# Patient Record
Sex: Female | Born: 1965 | Race: Black or African American | Hispanic: No | Marital: Single | Smoking: Never smoker
Health system: Southern US, Community
[De-identification: ages and names within clinical notes are randomized; demographics above are authoritative.]

## PROBLEM LIST (undated history)

## (undated) DIAGNOSIS — M329 Systemic lupus erythematosus, unspecified: Secondary | ICD-10-CM

## (undated) DIAGNOSIS — I10 Essential (primary) hypertension: Secondary | ICD-10-CM

## (undated) DIAGNOSIS — M797 Fibromyalgia: Secondary | ICD-10-CM

## (undated) DIAGNOSIS — E119 Type 2 diabetes mellitus without complications: Secondary | ICD-10-CM

---

## 2005-10-24 ENCOUNTER — Ambulatory Visit (HOSPITAL_COMMUNITY): Admission: RE | Admit: 2005-10-24 | Discharge: 2005-10-24 | Payer: Self-pay | Admitting: Obstetrics & Gynecology

## 2009-12-28 ENCOUNTER — Emergency Department (HOSPITAL_COMMUNITY): Admission: EM | Admit: 2009-12-28 | Discharge: 2009-12-28 | Payer: Self-pay | Admitting: Emergency Medicine

## 2011-03-10 LAB — BASIC METABOLIC PANEL
BUN: 9 mg/dL (ref 6–23)
CO2: 28 mEq/L (ref 19–32)
Calcium: 9.2 mg/dL (ref 8.4–10.5)
Chloride: 102 mEq/L (ref 96–112)
Creatinine, Ser: 0.89 mg/dL (ref 0.4–1.2)
GFR calc Af Amer: >60 mL/min (ref >60)
GFR calc non Af Amer: >60 mL/min (ref >60)
Glucose, Bld: 135 mg/dL — ABNORMAL HIGH (ref 70–99)
Potassium: 3.8 mEq/L (ref 3.5–5.1)
Sodium: 137 mEq/L (ref 135–145)

## 2011-03-10 LAB — CBC
HCT: 39.7 % (ref 36.0–46.0)
Hemoglobin: 13.4 g/dL (ref 12.0–15.0)
MCHC: 33.8 g/dL (ref 30.0–36.0)
MCV: 91.8 fL (ref 78.0–100.0)
RBC: 4.32 MIL/uL (ref 3.87–5.11)
RDW: 13.3 % (ref 11.5–15.5)
WBC: 5.6 10*3/uL (ref 4.0–10.5)

## 2011-03-10 LAB — DIFFERENTIAL
Basophils Absolute: 0 10*3/uL (ref 0.0–0.1)
Basophils Relative: 0 % (ref 0–1)
Eosinophils Absolute: 0 10*3/uL (ref 0.0–0.7)
Eosinophils Relative: 0 % (ref 0–5)
Lymphocytes Relative: 27 % (ref 12–46)
Lymphs Abs: 1.5 10*3/uL (ref 0.7–4.0)
Monocytes Absolute: 0.3 10*3/uL (ref 0.1–1.0)
Monocytes Relative: 6 % (ref 3–12)
Neutro Abs: 3.8 10*3/uL (ref 1.7–7.7)
Neutrophils Relative %: 67 % (ref 43–77)

## 2011-03-10 LAB — POCT CARDIAC MARKERS
CKMB, poc: <1 ng/mL — ABNORMAL LOW (ref 1.0–8.0)
Myoglobin, poc: 131 ng/mL (ref 12–200)
Troponin i, poc: <0.05 ng/mL (ref 0.00–0.09)

## 2011-05-09 ENCOUNTER — Other Ambulatory Visit: Payer: Self-pay | Admitting: Occupational Medicine

## 2011-05-09 ENCOUNTER — Ambulatory Visit: Payer: Self-pay

## 2011-05-09 DIAGNOSIS — M549 Dorsalgia, unspecified: Secondary | ICD-10-CM

## 2021-07-23 ENCOUNTER — Emergency Department (HOSPITAL_COMMUNITY)
Admission: EM | Admit: 2021-07-23 | Discharge: 2021-07-23 | Disposition: A | Discharge status: Home / Self Care | Payer: Medicaid Other | Attending: Emergency Medicine | Admitting: Emergency Medicine

## 2021-07-23 ENCOUNTER — Encounter (HOSPITAL_COMMUNITY): Payer: Self-pay | Admitting: *Deleted

## 2021-07-23 ENCOUNTER — Other Ambulatory Visit: Payer: Self-pay

## 2021-07-23 DIAGNOSIS — R1013 Epigastric pain: Secondary | ICD-10-CM

## 2021-07-23 DIAGNOSIS — E119 Type 2 diabetes mellitus without complications: Secondary | ICD-10-CM | POA: Insufficient documentation

## 2021-07-23 DIAGNOSIS — R110 Nausea: Secondary | ICD-10-CM | POA: Diagnosis not present

## 2021-07-23 HISTORY — DX: Type 2 diabetes mellitus without complications: E11.9

## 2021-07-23 LAB — CBC
HCT: 41.9 % (ref 36.0–46.0)
Hemoglobin: 13.5 g/dL (ref 12.0–15.0)
MCH: 30.3 pg (ref 26.0–34.0)
MCHC: 32.2 g/dL (ref 30.0–36.0)
MCV: 94.2 fL (ref 80.0–100.0)
Platelets: 277 10*3/uL (ref 150–400)
RBC: 4.45 MIL/uL (ref 3.87–5.11)
RDW: 13.5 % (ref 11.5–15.5)
WBC: 8.2 10*3/uL (ref 4.0–10.5)
nRBC: 0 % (ref 0.0–0.2)

## 2021-07-23 LAB — COMPREHENSIVE METABOLIC PANEL
ALT: 17 U/L (ref 0–44)
AST: 19 U/L (ref 15–41)
Albumin: 3.5 g/dL (ref 3.5–5.0)
Alkaline Phosphatase: 81 U/L (ref 38–126)
Anion gap: 7 (ref 5–15)
BUN: 13 mg/dL (ref 6–20)
CO2: 26 mmol/L (ref 22–32)
Calcium: 9.5 mg/dL (ref 8.9–10.3)
Chloride: 103 mmol/L (ref 98–111)
Creatinine, Ser: 0.92 mg/dL (ref 0.44–1.00)
GFR, Estimated: >60 mL/min (ref >60)
Glucose, Bld: 138 mg/dL — ABNORMAL HIGH (ref 70–99)
Potassium: 4.5 mmol/L (ref 3.5–5.1)
Sodium: 136 mmol/L (ref 135–145)
Total Bilirubin: 0.6 mg/dL (ref 0.3–1.2)
Total Protein: 7 g/dL (ref 6.5–8.1)

## 2021-07-23 LAB — URINALYSIS, ROUTINE W REFLEX MICROSCOPIC
Bacteria, UA: NONE SEEN
Bilirubin Urine: NEGATIVE
Glucose, UA: NEGATIVE mg/dL
Hgb urine dipstick: NEGATIVE
Ketones, ur: NEGATIVE mg/dL
Leukocytes,Ua: NEGATIVE
Nitrite: NEGATIVE
Protein, ur: NEGATIVE mg/dL
Specific Gravity, Urine: 1.012 (ref 1.005–1.030)
pH: 6 (ref 5.0–8.0)

## 2021-07-23 LAB — I-STAT BETA HCG BLOOD, ED (MC, WL, AP ONLY): I-stat hCG, quantitative: <5 m[IU]/mL (ref <5)

## 2021-07-23 LAB — LIPASE, BLOOD: Lipase: 24 U/L (ref 11–51)

## 2021-07-23 LAB — CBG MONITORING, ED: Glucose-Capillary: 142 mg/dL — ABNORMAL HIGH (ref 70–99)

## 2021-07-23 MED ORDER — DICYCLOMINE HCL 20 MG PO TABS: 20.0000 mg | ORAL_TABLET | Freq: Two times a day (BID) | ORAL | 0 refills | Status: AC

## 2021-07-23 MED ORDER — FAMOTIDINE 20 MG PO TABS
20.0000 mg | ORAL_TABLET | Freq: Once | ORAL | Status: AC
  Administered 2021-07-23: 20 mg via ORAL
  Filled 2021-07-23: qty 1

## 2021-07-23 MED ORDER — DICYCLOMINE HCL 10 MG PO CAPS
20.0000 mg | ORAL_CAPSULE | Freq: Once | ORAL | Status: AC
  Administered 2021-07-23: 20 mg via ORAL
  Filled 2021-07-23: qty 2

## 2021-07-23 MED ORDER — ONDANSETRON 4 MG PO TBDP
4.0000 mg | ORAL_TABLET | Freq: Once | ORAL | Status: AC
  Administered 2021-07-23: 4 mg via ORAL
  Filled 2021-07-23: qty 1

## 2021-07-23 MED ORDER — ACETAMINOPHEN 325 MG PO TABS
650.0000 mg | ORAL_TABLET | Freq: Four times a day (QID) | ORAL | Status: DC | PRN
  Administered 2021-07-23: 650 mg via ORAL
  Filled 2021-07-23: qty 2

## 2021-07-23 MED ORDER — ONDANSETRON 4 MG PO TBDP: 4.0000 mg | ORAL_TABLET | Freq: Three times a day (TID) | ORAL | 0 refills | Status: AC | PRN

## 2021-07-23 NOTE — ED Triage Notes (Signed)
Patient states she woke up apporx. 4am with nausea and states she went to the bathroom has several bowel movements that were soft.

## 2021-07-23 NOTE — Discharge Instructions (Signed)
Please follow-up with your primary care provider. I have also given you the information for a gastroenterology office here in [place].  Please call to make appointment with them.  Please use medications I prescribed you which are Bentyl--for pain.  Zofran--for nausea.  Please continue taking your omeprazole.  Please drink plenty of water.  Return to the ER for any new or concerning symptoms.

## 2021-07-23 NOTE — ED Provider Notes (Signed)
[facility] EMERGENCY DEPARTMENT Provider Note   CSN: [id number] Arrival date & time: 07/23/21  2774     History Chief Complaint  Patient presents with   Abdominal Pain   Nausea    Lorraine Carroll is a 55 y.o. female.  HPI Patient is a 55 year old female presenting today with a history of diabetes mellitus currently on injectable antihyperglycemic therapy not on insulin.  Patient presents emergency department today with abdominal pain that she states she woke up with at approximately 4 AM.  She states that it is epigastric achy associated with nausea.  She states that she had some soft bowel movements over the past couple days.  And had a large soft bowel movement this morning after her abdomen started hurting.  She denies any bright red blood PR.  Denies any vomiting.  No chest pain or shortness of breath.  No lightheadedness or dizziness.  No fevers or chills.  She states that the pain has improved some since she came to the ER.  She states that she has not taken any medications.  She does take omeprazole daily for reflux however states that she does not always take this medication.  She continues to feel somewhat nauseous currently.  She states that over the past few years this is happened intermittently she states 2 times a month is not abnormal for her.  She states that it seemed to be more uncomfortable than usual for her this episode however that level of severity has improved.  She is no longer in severe pain as she was earlier today.  She states that she was seen by a gastroenterologist before she moved to the [place] area and was not told of any specific diagnosis that she carries.    Past Medical History:  Diagnosis Date   Diabetes mellitus without complication (HCC)     There are no problems to display for this patient.   The histories are not reviewed yet. Please review them in the "History" navigator section and refresh this SmartLink.   OB  History   No obstetric history on file.     No family history on file.  Social History   Tobacco Use   Smoking status: Never   Smokeless tobacco: Never  Substance Use Topics   Alcohol use: Never    Home Medications Prior to Admission medications   Not on File    Allergies    Patient has no allergy information on record.  Review of Systems   Review of Systems  Constitutional:  Negative for chills and fever.  HENT:  Negative for congestion.   Eyes:  Negative for pain.  Respiratory:  Negative for cough and shortness of breath.   Cardiovascular:  Negative for chest pain and leg swelling.  Gastrointestinal:  Positive for abdominal pain and nausea. Negative for diarrhea (Loose stool, no watery diarrhea) and vomiting.  Genitourinary:  Negative for dysuria.  Musculoskeletal:  Negative for myalgias.  Skin:  Negative for rash.  Neurological:  Negative for dizziness and headaches.   Physical Exam Updated Vital Signs BP (!) 142/88   Pulse 65   Temp 98.3 F (36.8 C) (Oral)   Resp 18   Ht 5\' 2"  (1.575 m)   Wt 93 kg   SpO2 96%   BMI 37.49 kg/m   Physical Exam Vitals and nursing note reviewed.  Constitutional:      General: She is not in acute distress.    Comments: Pleasant well-appearing 55 year old.  In  no acute distress.  Sitting comfortably in bed.  Able answer questions appropriately follow commands. No increased work of breathing. Speaking in full sentences.  HENT:     Head: Normocephalic and atraumatic.     Nose: Nose normal.  Eyes:     General: No scleral icterus. Cardiovascular:     Rate and Rhythm: Normal rate and regular rhythm.     Pulses: Normal pulses.     Heart sounds: Normal heart sounds.  Pulmonary:     Effort: Pulmonary effort is normal. No respiratory distress.     Breath sounds: No wheezing.  Abdominal:     Palpations: Abdomen is soft.     Tenderness: There is no abdominal tenderness.     Comments: Abdomen is soft, nontender, no guarding or  rebound.  No CVA tenderness.  Musculoskeletal:     Cervical back: Normal range of motion.     Right lower leg: No edema.     Left lower leg: No edema.  Skin:    General: Skin is warm and dry.     Capillary Refill: Capillary refill takes less than 2 seconds.  Neurological:     Mental Status: She is alert. Mental status is at baseline.  Psychiatric:        Mood and Affect: Mood normal.        Behavior: Behavior normal.    ED Results / Procedures / Treatments   Labs (all labs ordered are listed, but only abnormal results are displayed) Labs Reviewed  URINALYSIS, ROUTINE W REFLEX MICROSCOPIC - Abnormal; Notable for the following components:      Result Value   Color, Urine STRAW (*)    All other components within normal limits  CBG MONITORING, ED - Abnormal; Notable for the following components:   Glucose-Capillary 142 (*)    All other components within normal limits  CBC  LIPASE, BLOOD  COMPREHENSIVE METABOLIC PANEL  I-STAT BETA HCG BLOOD, ED ([facility], [facility], [facility] ONLY)    EKG None  Radiology No results found.  Procedures Procedures   Medications Ordered in ED Medications  ondansetron (ZOFRAN-ODT) disintegrating tablet 4 mg (4 mg Oral Given 07/23/21 0804)  dicyclomine (BENTYL) capsule 20 mg (20 mg Oral Given 07/23/21 0803)  famotidine (PEPCID) tablet 20 mg (20 mg Oral Given 07/23/21 0803)    ED Course  I have reviewed the triage vital signs and the nursing notes.  Pertinent labs & imaging results that were available during my care of the patient were reviewed by me and considered in my medical decision making (see chart for details).    MDM Rules/Calculators/A&P                           Patient is a 55 year old female with past medical history detailed in HPI primarily notable for DM2 on Ozempic.  Patient is presented today with epigastric abdominal pain that began this morning.  She has a history of similar.  She states that usually occurs twice per month.  She denies any  vomiting but states that she is somewhat nauseous.  Has some epigastric abdominal pain.  Denies any chest pain or shortness of breath.  Physical exam is unremarkable patient is not significantly tender in her abdomen.  I-STAT hCG is negative for pregnancy.  Lipase within normal limits doubt pancreatitis.  CMP unremarkable.  Urinalysis without any evidence of infection CBC unremarkable.  Vital signs within normal limits mildly hypertensive.  States that she has  not taken her blood pressure medicine today.  On my reassessment patient states that her abdominal pain is gone.  Her repeat abdominal exam is benign.  Notably she is now complaining of a headache and states that she feels it is because she has not eaten anything yet today.  We will provide her with 1 dose of Tylenol, give her some food via RN and recommend close follow-up with her PCP as well as return precautions which were given to patient.  She is tolerating p.o. and ambulatory at time of discharge.  Final Clinical Impression(s) / ED Diagnoses Final diagnoses:  Epigastric abdominal pain    Rx / DC Orders ED Discharge Orders     None        Gailen Shelter, [place] 07/23/21 1049    Arby Barrette, MD 07/24/21 1243

## 2021-07-23 NOTE — ED Notes (Signed)
Patient refused to have blood drawn at triage, I talked to her about getting care started while waiting for a treatment room , however she still refused.

## 2021-09-06 ENCOUNTER — Other Ambulatory Visit: Payer: Self-pay

## 2021-09-06 ENCOUNTER — Emergency Department (HOSPITAL_COMMUNITY): Payer: No Typology Code available for payment source

## 2021-09-06 ENCOUNTER — Emergency Department (HOSPITAL_COMMUNITY)
Admission: EM | Admit: 2021-09-06 | Discharge: 2021-09-06 | Disposition: A | Discharge status: Home / Self Care | Payer: No Typology Code available for payment source | Attending: Emergency Medicine | Admitting: Emergency Medicine

## 2021-09-06 ENCOUNTER — Encounter (HOSPITAL_COMMUNITY): Payer: Self-pay | Admitting: Emergency Medicine

## 2021-09-06 DIAGNOSIS — M25512 Pain in left shoulder: Secondary | ICD-10-CM | POA: Diagnosis not present

## 2021-09-06 DIAGNOSIS — Z794 Long term (current) use of insulin: Secondary | ICD-10-CM | POA: Diagnosis not present

## 2021-09-06 DIAGNOSIS — Z7984 Long term (current) use of oral hypoglycemic drugs: Secondary | ICD-10-CM | POA: Diagnosis not present

## 2021-09-06 DIAGNOSIS — Y92410 Unspecified street and highway as the place of occurrence of the external cause: Secondary | ICD-10-CM | POA: Insufficient documentation

## 2021-09-06 DIAGNOSIS — E119 Type 2 diabetes mellitus without complications: Secondary | ICD-10-CM | POA: Insufficient documentation

## 2021-09-06 DIAGNOSIS — M5441 Lumbago with sciatica, right side: Secondary | ICD-10-CM | POA: Insufficient documentation

## 2021-09-06 DIAGNOSIS — M79604 Pain in right leg: Secondary | ICD-10-CM | POA: Insufficient documentation

## 2021-09-06 DIAGNOSIS — M545 Low back pain, unspecified: Secondary | ICD-10-CM | POA: Diagnosis present

## 2021-09-06 MED ORDER — CYCLOBENZAPRINE HCL 10 MG PO TABS: 5.0000 mg | ORAL_TABLET | Freq: Two times a day (BID) | ORAL | 0 refills | Status: AC | PRN

## 2021-09-06 MED ORDER — NAPROXEN 375 MG PO TABS: 375.0000 mg | ORAL_TABLET | Freq: Two times a day (BID) | ORAL | 0 refills | Status: AC
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
## 2021-09-06 NOTE — Discharge Instructions (Addendum)

## 2021-09-06 NOTE — ED Notes (Signed)
Pt ambulatory with steady gait to restroom 
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
## 2021-09-11 ENCOUNTER — Other Ambulatory Visit: Payer: Self-pay

## 2021-09-11 ENCOUNTER — Ambulatory Visit (HOSPITAL_COMMUNITY)
Admission: EM | Admit: 2021-09-11 | Discharge: 2021-09-11 | Disposition: A | Discharge status: Home / Self Care | Payer: Medicaid Other

## 2021-09-11 ENCOUNTER — Encounter (HOSPITAL_COMMUNITY): Payer: Self-pay | Admitting: Emergency Medicine

## 2021-09-11 DIAGNOSIS — M7918 Myalgia, other site: Secondary | ICD-10-CM
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
## 2021-09-11 NOTE — Discharge Instructions (Signed)
-  Please pick up your prescriptions as sent by emergency department provider. -PCP referral placed
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
## 2024-08-16 ENCOUNTER — Emergency Department
Admission: EM | Admit: 2024-08-16 | Discharge: 2024-08-16 | Disposition: A | Discharge status: Home / Self Care | Attending: Emergency Medicine | Admitting: Emergency Medicine

## 2024-08-16 ENCOUNTER — Other Ambulatory Visit: Payer: Self-pay

## 2024-08-16 ENCOUNTER — Encounter: Payer: Self-pay | Admitting: Emergency Medicine

## 2024-08-16 ENCOUNTER — Emergency Department

## 2024-08-16 DIAGNOSIS — R059 Cough, unspecified: Secondary | ICD-10-CM | POA: Diagnosis present

## 2024-08-16 DIAGNOSIS — E119 Type 2 diabetes mellitus without complications: Secondary | ICD-10-CM | POA: Insufficient documentation

## 2024-08-16 DIAGNOSIS — J069 Acute upper respiratory infection, unspecified: Secondary | ICD-10-CM | POA: Diagnosis not present

## 2024-08-16 DIAGNOSIS — J8410 Pulmonary fibrosis, unspecified: Secondary | ICD-10-CM | POA: Diagnosis not present

## 2024-08-16 LAB — RESP PANEL BY RT-PCR (RSV, FLU A&B, COVID)  RVPGX2
Influenza A by PCR: NEGATIVE
Influenza B by PCR: NEGATIVE
Resp Syncytial Virus by PCR: NEGATIVE
SARS Coronavirus 2 by RT PCR: NEGATIVE

## 2024-08-16 MED ORDER — IPRATROPIUM-ALBUTEROL 0.5-2.5 (3) MG/3ML IN SOLN
3.0000 mL | Freq: Once | RESPIRATORY_TRACT | Status: AC
  Administered 2024-08-16: 3 mL via RESPIRATORY_TRACT
  Filled 2024-08-16: qty 3

## 2024-08-16 NOTE — ED Provider Notes (Signed)
 [facility] Provider Note    Event Date/Time   First MD Initiated Contact with Patient 08/16/24 0217     (approximate)   History   Cough   HPI  Lorraine Carroll is a 58 year old female with history of diabetes presenting to the emergency department for evaluation of cough and congestion.  Patient reports that yesterday she had onset of cough with associated upper respiratory symptoms.  No chest pain or shortness of breath.  No fevers.  No known sick contacts.  Says this feels similar to when she has had an upper respiratory infection in the past.  Denies history of asthma or COPD but reports that she has had improvement with breathing treatments in the past.    Physical Exam   Triage Vital Signs: ED Triage Vitals  Encounter Vitals Group     BP 08/16/24 0127 (!) 150/83     Girls Systolic BP Percentile --      Girls Diastolic BP Percentile --      Boys Systolic BP Percentile --      Boys Diastolic BP Percentile --      Pulse Rate 08/16/24 0127 89     Resp 08/16/24 0127 20     Temp 08/16/24 0127 98.3 F (36.8 C)     Temp Source 08/16/24 0127 Oral     SpO2 08/16/24 0127 98 %     Weight 08/16/24 0127 227 lb (103 kg)     Height 08/16/24 0127 5' 2 (1.575 m)     Head Circumference --      Peak Flow --      Pain Score 08/16/24 0130 0     Pain Loc --      Pain Education --      Exclude from Growth Chart --     Most recent vital signs: Vitals:   08/16/24 0127  BP: (!) 150/83  Pulse: 89  Resp: 20  Temp: 98.3 F (36.8 C)  SpO2: 98%     General: Awake, interactive  HEENT: No significant swelling or erythema of the posterior oropharynx CV:  Regular rate, good peripheral perfusion.  Resp:  Unlabored respirations, frequent cough noted, mildly diminished lung sounds without frank wheezing Abd:  Nondistended.  Neuro:  Symmetric facial movement, fluid speech   ED Results / Procedures / Treatments   Labs (all labs ordered are listed, but only  abnormal results are displayed) Labs Reviewed  RESP PANEL BY RT-PCR (RSV, FLU A&B, COVID)  [id number]     EKG EKG independently reviewed and interpreted by myself demonstrates:    RADIOLOGY Imaging independently reviewed and interpreted by myself demonstrates:  CXR without focal consolidation  Formal Radiology Read:  DG Chest Portable 1 View Result Date: 08/16/2024 CLINICAL DATA:  Non productive cough and congestion EXAM: PORTABLE CHEST 1 VIEW COMPARISON:  09/06/2021 FINDINGS: Cardiac shadow is within normal limits. Lungs are clear bilaterally. Calcified granuloma in the right lung is again seen and stable. No bony abnormality is noted. IMPRESSION: No acute abnormality noted. Electronically Signed   By: Oneil Devonshire M.D.   On: 08/16/2024 02:07    PROCEDURES:  Critical Care performed: No  Procedures   MEDICATIONS ORDERED IN ED: Medications  ipratropium-albuterol  (DUONEB) 0.5-2.5 (3) MG/3ML nebulizer solution 3 mL (has no administration in time range)     IMPRESSION / MDM / ASSESSMENT AND PLAN / ED COURSE  I reviewed the triage vital signs and the nursing notes.  Differential diagnosis includes, but is  not limited to, viral illness, pneumonia, pneumothorax, allergies  Patient's presentation is most consistent with acute illness / injury with system symptoms.  58 year old female presenting with cough and congestion.  Stable vitals on presentation.  Viral swab negative.  X-[facility] without evidence of pneumonia.  Suspect likely upper respiratory infection.  Patient not frankly wheezy here, but has some diminished lung sounds and reports that she has had improvement with nebulizer treatments in the past.  Ordered for DuoNeb here.  Do think she is stable for discharge home.  Supportive care measures.  Strict return precautions provided.  Patient discharged in stable condition.      FINAL CLINICAL IMPRESSION(S) / ED DIAGNOSES   Final diagnoses:  Upper respiratory tract infection,  unspecified type     Rx / DC Orders   ED Discharge Orders     None        Note:  This document was prepared using Dragon voice recognition software and may include unintentional dictation errors.   Levander Slate, MD 08/16/24 [phone]

## 2024-08-16 NOTE — ED Triage Notes (Signed)
 Patient reports non-productive cough and congestion since yesterday. Denies recent sick contacts or fevers at home.

## 2024-08-16 NOTE — Discharge Instructions (Signed)
 You are seen in the emergency department today with your cough and congestion.  Your testing was fortunately reassuring.  Follow-up with your primary care doctor for further evaluation.  Return to the ER for new or worsening symptoms.

## 2024-09-28 ENCOUNTER — Other Ambulatory Visit: Payer: Self-pay

## 2024-09-28 DIAGNOSIS — Z5321 Procedure and treatment not carried out due to patient leaving prior to being seen by health care provider: Secondary | ICD-10-CM | POA: Diagnosis not present

## 2024-09-28 DIAGNOSIS — R112 Nausea with vomiting, unspecified: Secondary | ICD-10-CM | POA: Diagnosis present

## 2024-09-28 DIAGNOSIS — E119 Type 2 diabetes mellitus without complications: Secondary | ICD-10-CM | POA: Diagnosis not present

## 2024-09-28 DIAGNOSIS — R110 Nausea: Secondary | ICD-10-CM | POA: Diagnosis present

## 2024-09-28 DIAGNOSIS — I10 Essential (primary) hypertension: Secondary | ICD-10-CM | POA: Diagnosis not present

## 2024-09-28 LAB — CBC
HCT: 40.9 % (ref 36.0–46.0)
Hemoglobin: 13.6 g/dL (ref 12.0–15.0)
MCH: 30.2 pg (ref 26.0–34.0)
MCHC: 33.3 g/dL (ref 30.0–36.0)
MCV: 90.9 fL (ref 80.0–100.0)
Platelets: 295 K/uL (ref 150–400)
RBC: 4.5 MIL/uL (ref 3.87–5.11)
RDW: 13.2 % (ref 11.5–15.5)
WBC: 10.7 K/uL — ABNORMAL HIGH (ref 4.0–10.5)
nRBC: 0 % (ref 0.0–0.2)

## 2024-09-28 LAB — COMPREHENSIVE METABOLIC PANEL WITH GFR
ALT: 20 U/L (ref 0–44)
AST: 26 U/L (ref 15–41)
Albumin: 4.3 g/dL (ref 3.5–5.0)
Alkaline Phosphatase: 80 U/L (ref 38–126)
Anion gap: 10 (ref 5–15)
BUN: 17 mg/dL (ref 6–20)
CO2: 27 mmol/L (ref 22–32)
Calcium: 9.5 mg/dL (ref 8.9–10.3)
Chloride: 103 mmol/L (ref 98–111)
Creatinine, Ser: 0.98 mg/dL (ref 0.44–1.00)
GFR, Estimated: >60 mL/min (ref >60)
Glucose, Bld: 79 mg/dL (ref 70–99)
Potassium: 3.5 mmol/L (ref 3.5–5.1)
Sodium: 140 mmol/L (ref 135–145)
Total Bilirubin: 0.5 mg/dL (ref 0.0–1.2)
Total Protein: 8 g/dL (ref 6.5–8.1)

## 2024-09-28 MED ORDER — ONDANSETRON 4 MG PO TBDP
4.0000 mg | ORAL_TABLET | Freq: Once | ORAL | Status: AC
  Administered 2024-09-28: 4 mg via ORAL
  Filled 2024-09-28: qty 1

## 2024-09-28 NOTE — ED Triage Notes (Signed)
 Pt reports n/v that began earlier tonight, pt had episode of emesis while waiting in lobby. Pt denies abd pain

## 2024-09-29 ENCOUNTER — Other Ambulatory Visit: Payer: Self-pay

## 2024-09-29 ENCOUNTER — Emergency Department
Admission: EM | Admit: 2024-09-29 | Discharge: 2024-09-29 | Discharge status: Against Medical Advice | Attending: Emergency Medicine | Admitting: Emergency Medicine

## 2024-09-29 ENCOUNTER — Emergency Department
Admission: EM | Admit: 2024-09-29 | Discharge: 2024-09-29 | Disposition: A | Discharge status: Home / Self Care | Source: Home / Self Care | Attending: Emergency Medicine | Admitting: Emergency Medicine

## 2024-09-29 DIAGNOSIS — R110 Nausea: Secondary | ICD-10-CM | POA: Insufficient documentation

## 2024-09-29 DIAGNOSIS — E119 Type 2 diabetes mellitus without complications: Secondary | ICD-10-CM | POA: Insufficient documentation

## 2024-09-29 DIAGNOSIS — I10 Essential (primary) hypertension: Secondary | ICD-10-CM | POA: Insufficient documentation

## 2024-09-29 HISTORY — DX: Essential (primary) hypertension: I10

## 2024-09-29 HISTORY — DX: Fibromyalgia: M79.7

## 2024-09-29 HISTORY — DX: Systemic lupus erythematosus, unspecified: M32.9

## 2024-09-29 LAB — URINALYSIS, ROUTINE W REFLEX MICROSCOPIC
Bilirubin Urine: NEGATIVE
Glucose, UA: NEGATIVE mg/dL
Hgb urine dipstick: NEGATIVE
Ketones, ur: NEGATIVE mg/dL
Leukocytes,Ua: NEGATIVE
Nitrite: NEGATIVE
Protein, ur: NEGATIVE mg/dL
Specific Gravity, Urine: 1.004 — ABNORMAL LOW (ref 1.005–1.030)
pH: 7 (ref 5.0–8.0)

## 2024-09-29 LAB — CBC
HCT: 43.7 % (ref 36.0–46.0)
Hemoglobin: 14.2 g/dL (ref 12.0–15.0)
MCH: 30.2 pg (ref 26.0–34.0)
MCHC: 32.5 g/dL (ref 30.0–36.0)
MCV: 93 fL (ref 80.0–100.0)
Platelets: 294 K/uL (ref 150–400)
RBC: 4.7 MIL/uL (ref 3.87–5.11)
RDW: 13.2 % (ref 11.5–15.5)
WBC: 9.1 K/uL (ref 4.0–10.5)
nRBC: 0 % (ref 0.0–0.2)

## 2024-09-29 LAB — COMPREHENSIVE METABOLIC PANEL WITH GFR
ALT: 18 U/L (ref 0–44)
AST: 25 U/L (ref 15–41)
Albumin: 4.2 g/dL (ref 3.5–5.0)
Alkaline Phosphatase: 80 U/L (ref 38–126)
Anion gap: 12 (ref 5–15)
BUN: 14 mg/dL (ref 6–20)
CO2: 24 mmol/L (ref 22–32)
Calcium: 9.5 mg/dL (ref 8.9–10.3)
Chloride: 105 mmol/L (ref 98–111)
Creatinine, Ser: 0.89 mg/dL (ref 0.44–1.00)
GFR, Estimated: >60 mL/min (ref >60)
Glucose, Bld: 68 mg/dL — ABNORMAL LOW (ref 70–99)
Potassium: 3.7 mmol/L (ref 3.5–5.1)
Sodium: 141 mmol/L (ref 135–145)
Total Bilirubin: 0.6 mg/dL (ref 0.0–1.2)
Total Protein: 8.2 g/dL — ABNORMAL HIGH (ref 6.5–8.1)

## 2024-09-29 LAB — LIPASE, BLOOD
Lipase: 19 U/L (ref 11–51)
Lipase: 21 U/L (ref 11–51)

## 2024-09-29 LAB — CBG MONITORING, ED: Glucose-Capillary: 77 mg/dL (ref 70–99)

## 2024-09-29 MED ORDER — SODIUM CHLORIDE 0.9 % IV BOLUS
1000.0000 mL | Freq: Once | INTRAVENOUS | Status: AC
  Administered 2024-09-29: 1000 mL via INTRAVENOUS

## 2024-09-29 MED ORDER — ONDANSETRON HCL 4 MG/2ML IJ SOLN
4.0000 mg | Freq: Once | INTRAMUSCULAR | Status: AC
  Administered 2024-09-29: 4 mg via INTRAVENOUS
  Filled 2024-09-29: qty 2

## 2024-09-29 MED ORDER — PROMETHAZINE HCL 12.5 MG PO TABS: 12.5000 mg | ORAL_TABLET | Freq: Four times a day (QID) | ORAL | 0 refills | Status: AC | PRN

## 2024-09-29 MED ORDER — KETOROLAC TROMETHAMINE 15 MG/ML IJ SOLN
15.0000 mg | Freq: Once | INTRAMUSCULAR | Status: AC
  Administered 2024-09-29: 15 mg via INTRAVENOUS
  Filled 2024-09-29: qty 1

## 2024-09-29 NOTE — ED Notes (Signed)
 PA at bedside. Pt will be given juice and crackers for BG of 68.

## 2024-09-29 NOTE — ED Provider Notes (Signed)
 [facility] Provider Note    Event Date/Time   First MD Initiated Contact with Patient 09/29/24 1458     (approximate)   History   Nausea   HPI  Lorraine Carroll is a 58 y.o. female with PMH of diabetes, hypertension, lupus and fibromyalgia who presents for evaluation of nausea that began yesterday.  Patient came to the emergency department last night but left without being seen.  She has had 1 episode of vomiting since her symptoms began.  She denies cough, congestion, fevers, dysuria, diarrhea, constipation.  She does endorse urinary frequency and states she is concerned about being dehydrated.     Physical Exam   Triage Vital Signs: ED Triage Vitals  Encounter Vitals Group     BP 09/29/24 1403 128/86     Girls Systolic BP Percentile --      Girls Diastolic BP Percentile --      Boys Systolic BP Percentile --      Boys Diastolic BP Percentile --      Pulse Rate 09/29/24 1403 93     Resp 09/29/24 1403 18     Temp 09/29/24 1403 98.1 F (36.7 C)     Temp Source 09/29/24 1403 Oral     SpO2 09/29/24 1403 93 %     Weight 09/29/24 1402 223 lb (101.2 kg)     Height 09/29/24 1402 5' 2 (1.575 m)     Head Circumference --      Peak Flow --      Pain Score 09/29/24 1402 0     Pain Loc --      Pain Education --      Exclude from Growth Chart --     Most recent vital signs: Vitals:   09/29/24 1403  BP: 128/86  Pulse: 93  Resp: 18  Temp: 98.1 F (36.7 C)  SpO2: 93%   General: Awake, no distress.  CV:  Good peripheral perfusion.  RRR. Resp:  Normal effort.  CTAB. Abd:  No distention.  Soft, very mild tenderness to palpation throughout the abdomen. Other:     ED Results / Procedures / Treatments   Labs (all labs ordered are listed, but only abnormal results are displayed) Labs Reviewed  COMPREHENSIVE METABOLIC PANEL WITH GFR - Abnormal; Notable for the following components:      Result Value   Glucose, Bld 68 (*)    Total Protein 8.2 (*)     All other components within normal limits  URINALYSIS, ROUTINE W REFLEX MICROSCOPIC - Abnormal; Notable for the following components:   Color, Urine COLORLESS (*)    APPearance CLEAR (*)    Specific Gravity, Urine 1.004 (*)    All other components within normal limits  CBC  LIPASE, BLOOD  CBG MONITORING, ED    PROCEDURES:  Critical Care performed: No  Procedures   MEDICATIONS ORDERED IN ED: Medications  sodium chloride 0.9 % bolus 1,000 mL (1,000 mLs Intravenous New Bag/Given 09/29/24 1541)  ondansetron  (ZOFRAN ) injection 4 mg (4 mg Intravenous Given 09/29/24 1542)  ketorolac (TORADOL) 15 MG/ML injection 15 mg (15 mg Intravenous Given 09/29/24 1542)     IMPRESSION / MDM / ASSESSMENT AND PLAN / ED COURSE  I reviewed the triage vital signs and the nursing notes.                             58 year old female presents for evaluation of nausea.  Vital signs are stable patient NAD on exam.  Differential diagnosis includes, but is not limited to, viral infection, biliary disease, pancreatitis, gastroenteritis, acid reflux, UTI  Patient's presentation is most consistent with acute complicated illness / injury requiring diagnostic workup.  CMP unremarkable aside from mildly decreased glucose at 68, will give patient some juice and crackers.  CBC unremarkable.  Urinalysis unremarkable.  Physical exam is overall reassuring, patient does have some mild tenderness to palpation throughout the abdomen however labs are also reassuring so we will plan to hold off on imaging for now.  Will give patient some IV fluids, anti-inflammatory medication and nausea meds and reassess.  Clinical Course as of 09/29/24 1719  Wed Sep 29, 2024  1642 Patient reassessed and reports she is feeling better after the medications and fluids. Patient can be discharged when IV fluids finish. I will send some nausea medication to the pharmacy. She did not need a note for work.  [LD]  1700 POC CBG, ED Blood sugar  improved after giving patient some juice and crackers. [LD]    Clinical Course User Index [LD] Cleaster Tinnie LABOR, PA-C     FINAL CLINICAL IMPRESSION(S) / ED DIAGNOSES   Final diagnoses:  Nausea     Rx / DC Orders   ED Discharge Orders          Ordered    promethazine (PHENERGAN) 12.5 MG tablet  Every 6 hours PRN        09/29/24 1646             Note:  This document was prepared using Dragon voice recognition software and may include unintentional dictation errors.   Cleaster Tinnie LABOR, PA-C 09/29/24 1720    Jossie Artist POUR, MD 09/29/24 [phone]

## 2024-09-29 NOTE — ED Triage Notes (Signed)
 Patient states nausea and increased urination since yesterday, was here last night but left without being seen.

## 2024-09-29 NOTE — ED Notes (Signed)
 Pt vomited last night one time. Nausea ongoing since yesterday. States frequent urination. Pt is DM, takes oral antidiabetic either a glipizide or GLP1 agonist. Blood sugars have been in range with a few dips below 70. She states she thinks she is dehydrated. Mild lower abdominal cramping that just started. No diarrhea, LBM yesterday and feels constipated today.

## 2024-09-29 NOTE — Discharge Instructions (Signed)
 Your blood work was reassuring today. You were treated with IV fluids, anti-inflammatory medication and nausea medication. I have sent some nausea medication to your pharmacy. Return to the ED with any worsening symptoms.
